# Patient Record
Sex: Female | Born: 1995 | Race: White | Hispanic: No | Marital: Single | Smoking: Never smoker
Health system: Southern US, Community
[De-identification: ages and names within clinical notes are randomized; demographics above are authoritative.]

## PROBLEM LIST (undated history)

## (undated) DIAGNOSIS — I10 Essential (primary) hypertension: Secondary | ICD-10-CM

## (undated) HISTORY — PX: CHOLECYSTECTOMY: SHX55

---
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
## 2016-12-29 ENCOUNTER — Emergency Department (HOSPITAL_COMMUNITY)
Admission: EM | Admit: 2016-12-29 | Discharge: 2016-12-30 | Disposition: A | Discharge status: Home / Self Care | Payer: Medicaid Other | Attending: Emergency Medicine | Admitting: Emergency Medicine

## 2016-12-29 ENCOUNTER — Encounter (HOSPITAL_COMMUNITY): Payer: Self-pay | Admitting: Emergency Medicine

## 2016-12-29 DIAGNOSIS — R39198 Other difficulties with micturition: Secondary | ICD-10-CM | POA: Diagnosis present

## 2016-12-29 DIAGNOSIS — B3731 Acute candidiasis of vulva and vagina: Secondary | ICD-10-CM

## 2016-12-29 DIAGNOSIS — B373 Candidiasis of vulva and vagina: Secondary | ICD-10-CM | POA: Diagnosis not present

## 2016-12-29 LAB — URINALYSIS, ROUTINE W REFLEX MICROSCOPIC
BILIRUBIN URINE: NEGATIVE
Glucose, UA: NEGATIVE mg/dL
Ketones, ur: NEGATIVE mg/dL
NITRITE: NEGATIVE
Protein, ur: >=300 mg/dL — AB
SPECIFIC GRAVITY, URINE: 1.026 (ref 1.005–1.030)
pH: 5 (ref 5.0–8.0)

## 2016-12-29 LAB — PREGNANCY, URINE: Preg Test, Ur: NEGATIVE

## 2016-12-29 MED ORDER — PHENAZOPYRIDINE HCL 100 MG PO TABS
200.0000 mg | ORAL_TABLET | Freq: Once | ORAL | Status: AC
  Administered 2016-12-29: 200 mg via ORAL
  Filled 2016-12-29: qty 2

## 2016-12-29 NOTE — ED Triage Notes (Signed)
UTI SX since last night  Pt reports no PCP

## 2016-12-30 MED ORDER — FLUCONAZOLE 150 MG PO TABS: ORAL_TABLET | ORAL | 0 refills | Status: DC

## 2016-12-30 NOTE — Discharge Instructions (Signed)
Take the medicine as prescribed.

## 2016-12-30 NOTE — ED Provider Notes (Signed)
[facility] EMERGENCY DEPARTMENT Provider Note   CSN: [id number] Arrival date & time: 12/29/16  2012     History   Chief Complaint Chief Complaint  Patient presents with  . Urinary Frequency    HPI Katherine Moran is a 21 y.o. female presenting with burning pain with urination since yesterday evening.  She denies increased urinary frequency and has had no nausea, vomiting, abdominal or pelvic or back pain, hematuria but has noticed mild burning irritation along with white slightly increased vaginal discharge.  She denies possibility of STDs.  HPI  History reviewed. No pertinent past medical history.  There are no active problems to display for this patient.   History reviewed. No pertinent surgical history.  OB History    No data available       Home Medications    Prior to Admission medications   Medication Sig Start Date End Date Taking? Authorizing Provider  fluconazole (DIFLUCAN) 150 MG tablet Take one tablet by mouth on day 1, then one tablet on day 3 if symptoms persist 12/30/16   Burgess AmorIdol, Ilaisaane Marts, PA-C    Family History History reviewed. No pertinent family history.  Social History Social History   Tobacco Use  . Smoking status: Never Smoker  . Smokeless tobacco: Never Used  Substance Use Topics  . Alcohol use: No  . Drug use: No     Allergies   Patient has no known allergies.   Review of Systems Review of Systems  Constitutional: Negative for fever.  HENT: Negative for congestion and sore throat.   Eyes: Negative.   Respiratory: Negative for chest tightness and shortness of breath.   Cardiovascular: Negative for chest pain.  Gastrointestinal: Negative for abdominal pain and nausea.  Genitourinary: Positive for dysuria and vaginal discharge. Negative for frequency, hematuria, menstrual problem and urgency.  Musculoskeletal: Negative for arthralgias, joint swelling and neck pain.  Skin: Negative.  Negative for rash and wound.  Neurological:  Negative for dizziness, weakness, light-headedness, numbness and headaches.  Psychiatric/Behavioral: Negative.      Physical Exam Updated Vital Signs BP (!) 135/97 (BP Location: Right Arm)   Pulse (!) 104   Temp 97.6 F (36.4 C) (Tympanic)   Resp 20   Ht 5\' 5"  (1.651 m)   Wt 77.1 kg (170 lb)   SpO2 97%   BMI 28.29 kg/m   Physical Exam  Constitutional: She appears well-developed and well-nourished.  HENT:  Head: Normocephalic and atraumatic.  Eyes: Conjunctivae are normal.  Neck: Normal range of motion.  Cardiovascular: Normal rate, regular rhythm, normal heart sounds and intact distal pulses.  Pulmonary/Chest: Effort normal and breath sounds normal. She has no wheezes.  Abdominal: Soft. Bowel sounds are normal. There is no tenderness.  Musculoskeletal: Normal range of motion.  Neurological: She is alert.  Skin: Skin is warm and dry.  Psychiatric: She has a normal mood and affect.  Nursing note and vitals reviewed.    ED Treatments / Results  Labs (all labs ordered are listed, but only abnormal results are displayed) Results for orders placed or performed during the hospital encounter of 12/29/16  Pregnancy, urine  Result Value Ref Range   Preg Test, Ur NEGATIVE NEGATIVE  Urinalysis, Routine w reflex microscopic  Result Value Ref Range   Color, Urine YELLOW YELLOW   APPearance CLOUDY (A) CLEAR   Specific Gravity, Urine 1.026 1.005 - 1.030   pH 5.0 5.0 - 8.0   Glucose, UA NEGATIVE NEGATIVE mg/dL   Hgb urine dipstick LARGE (  A) NEGATIVE   Bilirubin Urine NEGATIVE NEGATIVE   Ketones, ur NEGATIVE NEGATIVE mg/dL   Protein, ur >=409>=300 (A) NEGATIVE mg/dL   Nitrite NEGATIVE NEGATIVE   Leukocytes, UA LARGE (A) NEGATIVE   RBC / HPF TOO NUMEROUS TO COUNT 0 - 5 RBC/hpf   WBC, UA 6-30 0 - 5 WBC/hpf   Bacteria, UA RARE (A) NONE SEEN   Squamous Epithelial / LPF 6-30 (A) NONE SEEN   Mucus PRESENT    Budding Yeast PRESENT    Ca Oxalate Crys, UA PRESENT    Non Squamous  Epithelial 6-30 (A) NONE SEEN   No results found.   EKG  EKG Interpretation None       Radiology No results found.  Procedures Procedures (including critical care time)  Medications Ordered in ED Medications  phenazopyridine (PYRIDIUM) tablet 200 mg (200 mg Oral Given 12/29/16 2228)     Initial Impression / Assessment and Plan / ED Course  I have reviewed the triage vital signs and the nursing notes.  Pertinent labs & imaging results that were available during my care of the patient were reviewed by me and considered in my medical decision making (see chart for details).     Labs reviewed and discussed with patient.  No clear-cut UTI, culture was sent.  She does have yeast however which would also explain the vaginal discharge.  Discussed full pelvic exam rule out other possibilities such as STDs, patient defers at this time.  I will treat her with Diflucan and she was advised recheck for any persistent or worsening symptoms.  The patient appears reasonably screened and/or stabilized for discharge and I doubt any other medical condition or other [facility] requiring further screening, evaluation, or treatment in the ED at this time prior to discharge.   Final Clinical Impressions(s) / ED Diagnoses   Final diagnoses:  Yeast vaginitis    ED Discharge Orders        Ordered    fluconazole (DIFLUCAN) 150 MG tablet     12/30/16 0019       Burgess Amordol, Lajuanna Pompa, PA-C 12/30/16 Judie [id number]    Zammit, Joseph, MD 12/31/16 [phone]

## 2017-01-01 LAB — URINE CULTURE: Culture: 70000 — AB

## 2017-01-02 ENCOUNTER — Telehealth: Payer: Self-pay

## 2017-01-02 NOTE — Progress Notes (Signed)
ED Antimicrobial Stewardship Positive Culture Follow Up   Katherine Moran is an 21 y.o. female who presented to [facility] 12/29/2016 with a chief complaint of  Chief Complaint  Patient presents with  . Urinary Frequency    Recent Results (from the past 720 hour(s))  Urine Culture     Status: Abnormal   Collection Time: 12/30/16 12:16 AM  Result Value Ref Range Status   Specimen Description URINE, CLEAN CATCH  Final   Special Requests NONE  Final   Culture 70,000 COLONIES/mL ESCHERICHIA COLI (A)  Final   Report Status 01/01/2017 FINAL  Final   Organism ID, Bacteria ESCHERICHIA COLI (A)  Final      Susceptibility   Escherichia coli - MIC*    AMPICILLIN >=32 RESISTANT Resistant     CEFAZOLIN <=4 SENSITIVE Sensitive     CEFTRIAXONE <=1 SENSITIVE Sensitive     CIPROFLOXACIN <=0.25 SENSITIVE Sensitive     GENTAMICIN >=16 RESISTANT Resistant     IMIPENEM <=0.25 SENSITIVE Sensitive     NITROFURANTOIN <=16 SENSITIVE Sensitive     TRIMETH/SULFA >=320 RESISTANT Resistant     AMPICILLIN/SULBACTAM 16 INTERMEDIATE Intermediate     PIP/TAZO <=4 SENSITIVE Sensitive     Extended ESBL NEGATIVE Sensitive     * 70,000 COLONIES/mL ESCHERICHIA COLI    [x]  Treated with fluconazole, organism resistant to prescribed antimicrobial []  Patient discharged originally without antimicrobial agent and treatment is now indicated  New antibiotic prescription: If pt still with symptoms, start cephalexin 500mg  PO BID x 7 days  ED Provider: Frederik PearMia McDonald, PA   [name], [name] 01/02/2017, 9:41 AM Clinical Pharmacist Phone# [phone]

## 2017-01-02 NOTE — Telephone Encounter (Signed)
Post ED Visit - Positive Culture Follow-up: Successful Patient Follow-Up  Culture assessed and recommendations reviewed by: []  Enzo BiNathan Batchelder, Pharm.D. []  Celedonio MiyamotoJeremy Frens, Pharm.D., BCPS AQ-ID []  Garvin FilaMike Maccia, Pharm.D., BCPS []  Georgina PillionElizabeth Martin, Pharm.D., BCPS []  [place] Pham, [place]arm.D., BCPS, AAHIVP []  Estella HuskMichelle Turner, Pharm.D., BCPS, AAHIVP [x]  Lysle Pearlachel Rumbarger, PharmD, BCPS []  Casilda Carlsaylor Stone, PharmD, BCPS []  Pollyann SamplesAndy Johnston, PharmD, BCPS  Positive urine culture  [x]  Patient discharged without antimicrobial prescription and treatment is now indicated []  Organism is resistant to prescribed ED discharge antimicrobial []  Patient with positive blood cultures  Changes discussed with ED provider: Frederik PearMia McDonald PA-C New antibiotic prescription Keflex 500 mg BID x 7 days [facility] [phone]  Contacted patient, date 01/02/17, time 1000   Katherine Moran, Linnell FullingRose Burnett 01/02/2017, 9:59 AM

## 2017-04-28 IMAGING — DX DG LUMBAR SPINE COMPLETE 4+V
5 series · 5 of 5 positions shown · non-contrast
Comparison: Plain films lumbar spine 01/05/2015 and 12/01/2014.

CLINICAL DATA: Low back pain since moving furniture yesterday.
Initial encounter.

EXAM:
LUMBAR SPINE - COMPLETE 4+ VIEW

[l-spine ap]
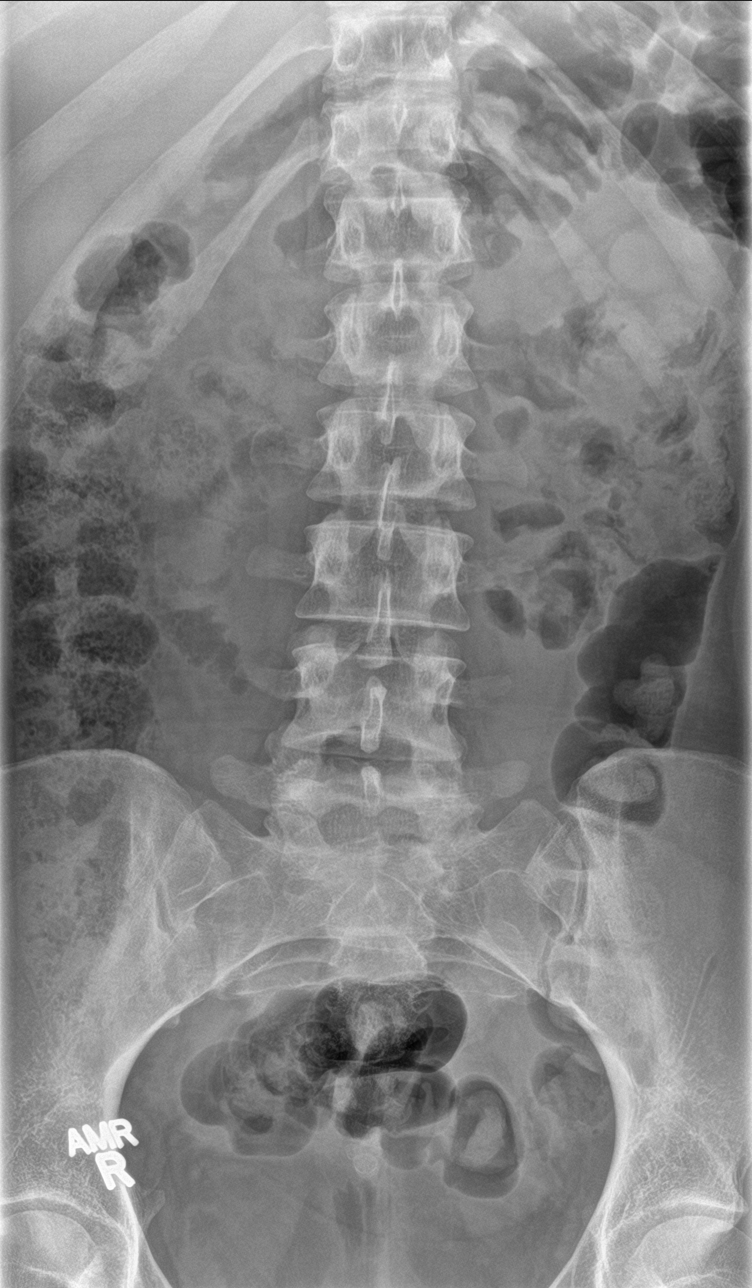

[l-spine obl (1 of 2)]
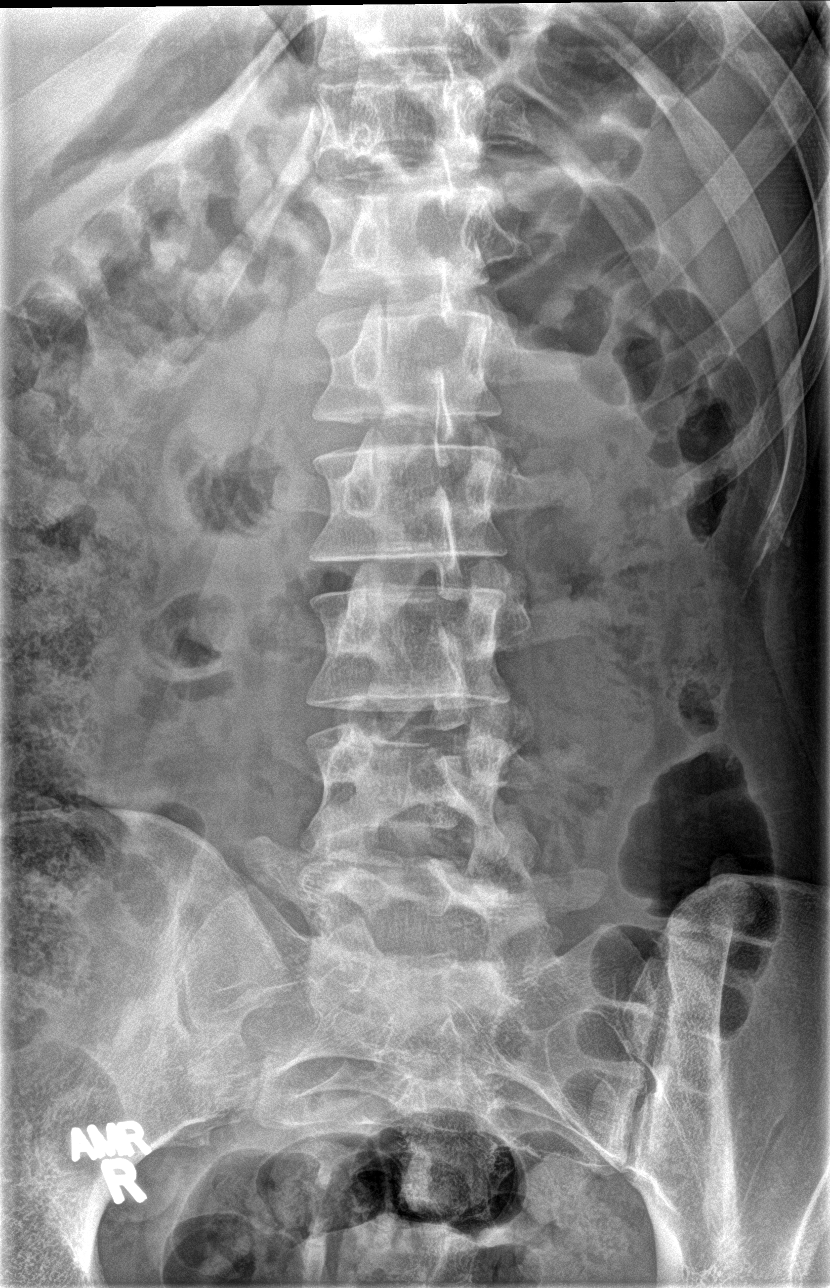

[l-spine obl (2 of 2)]
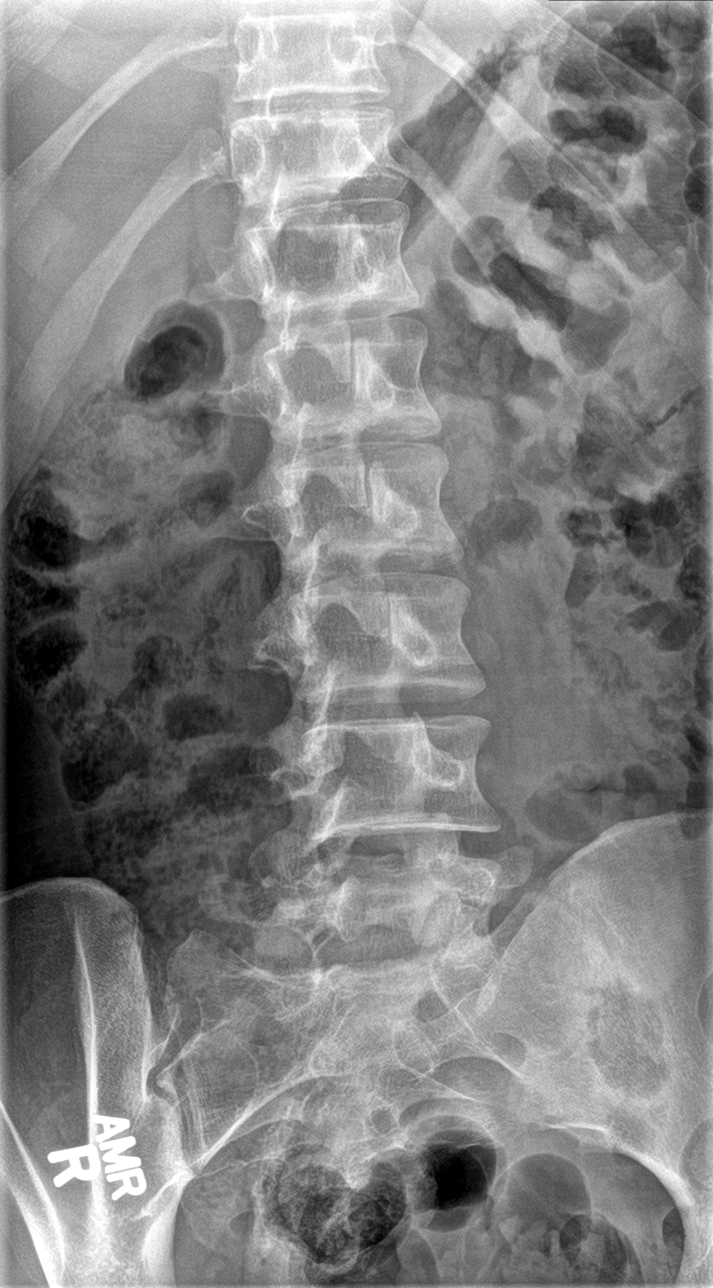

[l-spine lat]
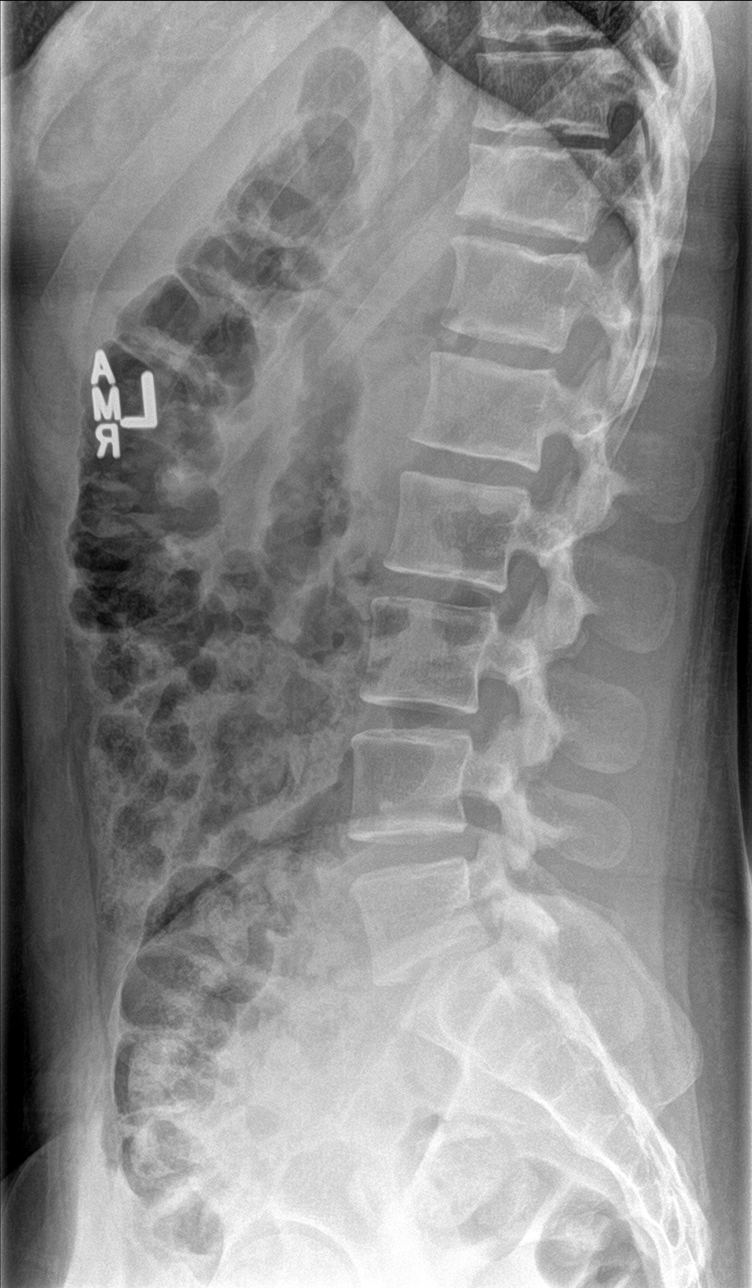

[l-spine spot]
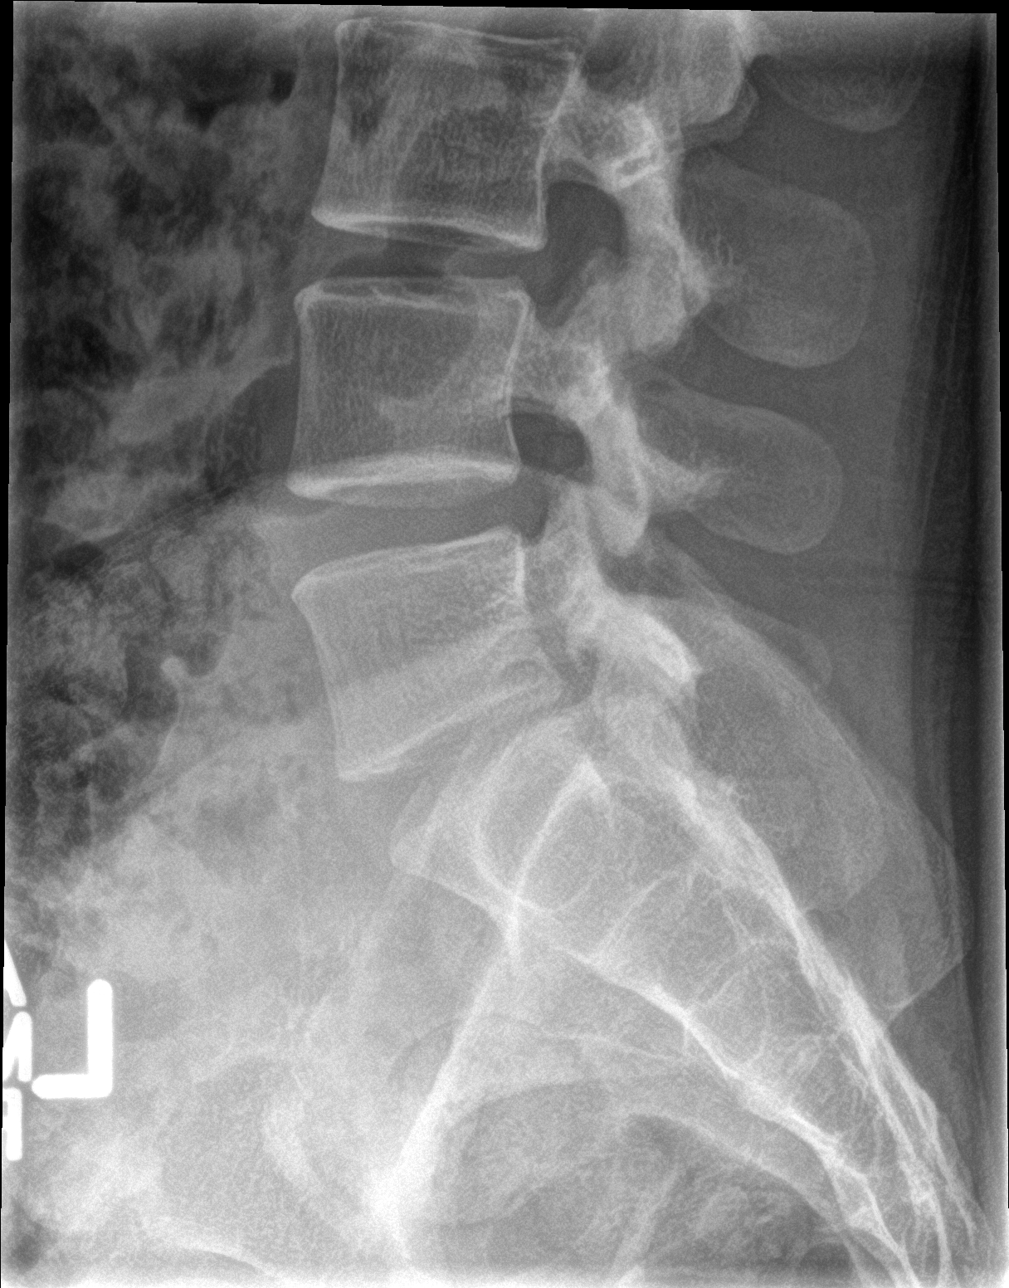

[5 of 5 positions shown; findings below may reference images not displayed]

FINDINGS: As noted on the prior examinations,the patient has 6 lumbar type
vertebral bodies. Vertebral body height and alignment are normal.
Intervertebral disc space height is maintained. There is no pars
interarticularis defect. Paraspinous structures are unremarkable.
IMPRESSION: Negative exam.

## 2020-10-15 ENCOUNTER — Other Ambulatory Visit: Payer: Self-pay

## 2020-10-15 ENCOUNTER — Encounter (HOSPITAL_COMMUNITY): Payer: Self-pay | Admitting: Emergency Medicine

## 2020-10-15 ENCOUNTER — Emergency Department (HOSPITAL_COMMUNITY)
Admission: EM | Admit: 2020-10-15 | Discharge: 2020-10-15 | Disposition: A | Discharge status: Home / Self Care | Payer: Medicaid - Out of State | Attending: Emergency Medicine | Admitting: Emergency Medicine

## 2020-10-15 DIAGNOSIS — N939 Abnormal uterine and vaginal bleeding, unspecified: Secondary | ICD-10-CM | POA: Insufficient documentation

## 2020-10-15 DIAGNOSIS — I10 Essential (primary) hypertension: Secondary | ICD-10-CM | POA: Insufficient documentation

## 2020-10-15 DIAGNOSIS — R1030 Lower abdominal pain, unspecified: Secondary | ICD-10-CM | POA: Diagnosis present

## 2020-10-15 HISTORY — DX: Essential (primary) hypertension: I10

## 2020-10-15 LAB — COMPREHENSIVE METABOLIC PANEL
ALT: 37 U/L (ref 0–44)
AST: 22 U/L (ref 15–41)
Albumin: 4.3 g/dL (ref 3.5–5.0)
Alkaline Phosphatase: 75 U/L (ref 38–126)
Anion gap: 11 (ref 5–15)
BUN: 13 mg/dL (ref 6–20)
CO2: 25 mmol/L (ref 22–32)
Calcium: 9.7 mg/dL (ref 8.9–10.3)
Chloride: 103 mmol/L (ref 98–111)
Creatinine, Ser: 0.84 mg/dL (ref 0.44–1.00)
GFR, Estimated: >60 mL/min (ref >60)
Glucose, Bld: 99 mg/dL (ref 70–99)
Potassium: 4 mmol/L (ref 3.5–5.1)
Sodium: 139 mmol/L (ref 135–145)
Total Bilirubin: 0.5 mg/dL (ref 0.3–1.2)
Total Protein: 8 g/dL (ref 6.5–8.1)

## 2020-10-15 LAB — CBC
HCT: 41.3 % (ref 36.0–46.0)
Hemoglobin: 13.5 g/dL (ref 12.0–15.0)
MCH: 27.5 pg (ref 26.0–34.0)
MCHC: 32.7 g/dL (ref 30.0–36.0)
MCV: 84.1 fL (ref 80.0–100.0)
Platelets: 327 10*3/uL (ref 150–400)
RBC: 4.91 MIL/uL (ref 3.87–5.11)
RDW: 13.8 % (ref 11.5–15.5)
WBC: 10.1 10*3/uL (ref 4.0–10.5)
nRBC: 0 % (ref 0.0–0.2)

## 2020-10-15 LAB — LIPASE, BLOOD: Lipase: 33 U/L (ref 11–51)

## 2020-10-15 LAB — URINALYSIS, MICROSCOPIC (REFLEX): RBC / HPF: >50 RBC/hpf (ref 0–5)

## 2020-10-15 LAB — URINALYSIS, ROUTINE W REFLEX MICROSCOPIC
Specific Gravity, Urine: 1.025 (ref 1.005–1.030)
pH: 5.5 (ref 5.0–8.0)

## 2020-10-15 LAB — HCG, QUANTITATIVE, PREGNANCY: hCG, Beta Chain, Quant, S: <1 m[IU]/mL (ref <5)

## 2020-10-15 MED ORDER — NAPROXEN 500 MG PO TABS: 500.0000 mg | ORAL_TABLET | Freq: Two times a day (BID) | ORAL | 0 refills | Status: AC

## 2020-10-15 MED ORDER — ACETAMINOPHEN 500 MG PO TABS
1000.0000 mg | ORAL_TABLET | Freq: Once | ORAL | Status: AC
  Administered 2020-10-15: 1000 mg via ORAL
  Filled 2020-10-15: qty 2

## 2020-10-15 NOTE — ED Provider Notes (Signed)
[facility]-EMERGENCY DEPT [facility] Emergency Department Provider Note MRN:  [id number]  Arrival date & time: 10/15/20     Chief Complaint   Abdominal Pain   History of Present Illness   Katherine Moran is a 25 y.o. year-old female with no pertinent past medical history presenting to the ED with chief complaint of abdominal pain, vaginal bleeding.  Location: Lower abdomen Duration: 2 days Onset: Gradual Timing: Constant, comes in waves Description: Cramping Severity: Moderate Exacerbating/Alleviating Factors: None Associated Symptoms: Heavy vaginal bleeding Pertinent Negatives: No fever, no chest pain or shortness of breath, no upper abdominal pain, no vaginal discharge, no rough sex or vaginal trauma  Additional History: None  Review of Systems  A complete 10 system review of systems was obtained and all systems are negative except as noted in the HPI and PMH.   Patient's Health History    Past Medical History:  Diagnosis Date   Hypertension     History reviewed. No pertinent surgical history.  History reviewed. No pertinent family history.  Social History   Socioeconomic History   Marital status: Single    Spouse name: Not on file   Number of children: Not on file   Years of education: Not on file   Highest education level: Not on file  Occupational History   Not on file  Tobacco Use   Smoking status: Never   Smokeless tobacco: Never  Vaping Use   Vaping Use: Never used  Substance and Sexual Activity   Alcohol use: No   Drug use: No   Sexual activity: Yes    Birth control/protection: None  Other Topics Concern   Not on file  Social History Narrative   Not on file   Social Determinants of Health   Financial Resource Strain: Not on file  Food Insecurity: Not on file  Transportation Needs: Not on file  Physical Activity: Not on file  Stress: Not on file  Social Connections: Not on file  Intimate Partner Violence: Not on file     Physical Exam    Vitals:   10/15/20 0052 10/15/20 0150  BP: (!) 144/95 129/73  Pulse: 93 86  Resp: 20 18  Temp: 98.6 F (37 C)   SpO2: 100% 100%    CONSTITUTIONAL: Well-appearing, NAD NEURO:  Alert and oriented x 3, no focal deficits EYES:  eyes equal and reactive ENT/NECK:  no LAD, no JVD CARDIO: Regular rate, well-perfused, normal S1 and S2 PULM:  CTAB no wheezing or rhonchi GI/GU:  normal bowel sounds, non-distended, non-tender MSK/SPINE:  No gross deformities, no edema SKIN:  no rash, atraumatic PSYCH:  Appropriate speech and behavior  *Additional and/or pertinent findings included in MDM below  Diagnostic and Interventional Summary    EKG Interpretation  Date/Time:    Ventricular Rate:    PR Interval:    QRS Duration:   QT Interval:    QTC Calculation:   R Axis:     Text Interpretation:         Labs Reviewed  URINALYSIS, ROUTINE W REFLEX MICROSCOPIC - Abnormal; Notable for the following components:      Result Value   Color, Urine RED (*)    Glucose, UA   (*)    Value: TEST NOT REPORTED DUE TO COLOR INTERFERENCE OF URINE PIGMENT   Hgb urine dipstick   (*)    Value: TEST NOT REPORTED DUE TO COLOR INTERFERENCE OF URINE PIGMENT   Bilirubin Urine   (*)    Value: TEST  NOT REPORTED DUE TO COLOR INTERFERENCE OF URINE PIGMENT   Ketones, ur   (*)    Value: TEST NOT REPORTED DUE TO COLOR INTERFERENCE OF URINE PIGMENT   Protein, ur   (*)    Value: TEST NOT REPORTED DUE TO COLOR INTERFERENCE OF URINE PIGMENT   Nitrite   (*)    Value: TEST NOT REPORTED DUE TO COLOR INTERFERENCE OF URINE PIGMENT   Leukocytes,Ua   (*)    Value: TEST NOT REPORTED DUE TO COLOR INTERFERENCE OF URINE PIGMENT   All other components within normal limits  URINALYSIS, MICROSCOPIC (REFLEX) - Abnormal; Notable for the following components:   Bacteria, UA MANY (*)    All other components within normal limits  LIPASE, BLOOD  COMPREHENSIVE METABOLIC PANEL  CBC  HCG, QUANTITATIVE, PREGNANCY    No orders  to display    Medications  acetaminophen (TYLENOL) tablet 1,000 mg (1,000 mg Oral Given 10/15/20 0211)     Procedures  /  Critical Care Procedures  ED Course and Medical Decision Making  I have reviewed the triage vital signs, the nursing notes, and pertinent available records from the EMR.  Listed above are laboratory and imaging tests that I personally ordered, reviewed, and interpreted and then considered in my medical decision making (see below for details).  Abdomen is soft and nontender, vital signs are normal, no fever.  Patient reports heavy bleeding, much heavier than normal menstrual cycle, but the timing of this bleeding would be consistent with her normal menstrual cycle.  hCG is negative, largely excluding ectopic pregnancy.  H&H is reassuring.  Patient is appropriate for discharge with GYN follow-up.       Elmer Sow. Pilar Plate, MD [facility] Emergency Medicine [facility] [email or web address]  Final Clinical Impressions(s) / ED Diagnoses     ICD-10-CM   1. Abnormal uterine bleeding  N93.9       ED Discharge Orders          Ordered    naproxen (NAPROSYN) 500 MG tablet  2 times daily        10/15/20 0406             Discharge Instructions Discussed with and Provided to Patient:    Discharge Instructions      You were evaluated in the Emergency Department and after careful evaluation, we did not find any emergent condition requiring admission or further testing in the hospital.  Your exam/testing today is overall reassuring.  Recommend follow-up with an OB/GYN doctor to further discuss her symptoms.  Can use the Naprosyn anti-inflammatory as needed for pain.  Please return to the Emergency Department if you experience any worsening of your condition.   Thank you for allowing [place] to be a part of your care.       Sabas Sous, MD 10/15/20 [phone]

## 2020-10-15 NOTE — Discharge Instructions (Signed)
You were evaluated in the Emergency Department and after careful evaluation, we did not find any emergent condition requiring admission or further testing in the hospital.  Your exam/testing today is overall reassuring.  Recommend follow-up with an OB/GYN doctor to further discuss her symptoms.  Can use the Naprosyn anti-inflammatory as needed for pain.  Please return to the Emergency Department if you experience any worsening of your condition.   Thank you for allowing [place] to be a part of your care.

## 2020-10-15 NOTE — ED Triage Notes (Signed)
Pt c/o abd pain for the past 2 weeks. Pt states today she began having heavy vaginal bleeding.

## 2022-09-09 ENCOUNTER — Encounter (HOSPITAL_COMMUNITY): Payer: Self-pay

## 2022-09-09 ENCOUNTER — Other Ambulatory Visit: Payer: Self-pay

## 2022-09-09 ENCOUNTER — Emergency Department (HOSPITAL_COMMUNITY): Payer: Medicaid Other

## 2022-09-09 ENCOUNTER — Emergency Department (HOSPITAL_COMMUNITY)
Admission: EM | Admit: 2022-09-09 | Discharge: 2022-09-09 | Disposition: A | Discharge status: Home / Self Care | Payer: Medicaid Other | Attending: Emergency Medicine | Admitting: Emergency Medicine

## 2022-09-09 DIAGNOSIS — R230 Cyanosis: Secondary | ICD-10-CM | POA: Diagnosis present

## 2022-09-09 NOTE — ED Provider Notes (Signed)
[place] [facility]MENT AT [facility] Provider Note   CSN: [id number] Arrival date & time: 09/09/22  0158     History  Chief Complaint  Patient presents with   Bleeding/Bruising    Katherine Moran is a 27 y.o. female.  The history is provided by the patient.  She noted some bruising/discoloration on the knuckles of both hands and on her right elbow.  She noticed that tonight.  She denies any trauma.  She is complaining of some mild pain in all of those joints.  Of note, she is approximately 1 month status post cholecystectomy, doing well from that standpoint.   Home Medications Prior to Admission medications   Medication Sig Start Date End Date Taking? Authorizing Provider  fluconazole (DIFLUCAN) 150 MG tablet Take one tablet by mouth on day 1, then one tablet on day 3 if symptoms persist 12/30/16   Idol, Raynelle Fanning, PA-C  naproxen (NAPROSYN) 500 MG tablet Take 1 tablet (500 mg total) by mouth 2 (two) times daily. 10/15/20   Sabas Sous, MD      Allergies    Norco [hydrocodone-acetaminophen]    Review of Systems   Review of Systems  All other systems reviewed and are negative.   Physical Exam Updated Vital Signs BP (!) 145/102 (BP Location: Right Arm)   Pulse 98   Temp 98.6 F (37 C) (Oral)   Resp 18   Ht 5\' 5"  (1.651 m)   Wt 106.6 kg   LMP 08/09/2022 (Approximate)   SpO2 98%   BMI 39.11 kg/m  Physical Exam Vitals and nursing note reviewed.   27 year old female, resting comfortably and in no acute distress. Vital signs are significant for elevated blood pressure. Oxygen saturation is 98%, which is normal. Head is normocephalic and atraumatic. PERRLA, EOMI.  Lungs are clear without rales, wheezes, or rhonchi. Chest is nontender. Heart has regular rate and rhythm without murmur. Abdomen is soft, flat, nontender. Extremities: There is a faint, plaque discoloration over the right olecranon area and the dorsum of the MCP joints of both hands.   There is minimal tenderness, no swelling.  Full passive range of motion is present. Skin is warm and dry without rash. Neurologic: Mental status is normal, cranial nerves are intact, moves all extremities equally.  ED Results / Procedures / Treatments    Radiology DG Hand Complete Left  Result Date: 09/09/2022 CLINICAL DATA:  Bruising. EXAM: LEFT HAND - COMPLETE 3+ VIEW COMPARISON:  None Available. FINDINGS: There is no evidence of fracture or dislocation. There is no evidence of arthropathy or other focal bone abnormality. Mild thenar soft tissue edema. IMPRESSION: Mild thenar soft tissue edema. No fracture or subluxation. Electronically Signed   By: Narda Rutherford M.D.   On: 09/09/2022 03:54   DG Hand Complete Right  Result Date: 09/09/2022 CLINICAL DATA:  Bruising. EXAM: RIGHT HAND - COMPLETE 3+ VIEW COMPARISON:  None Available. FINDINGS: There is no evidence of fracture or dislocation. There is no evidence of arthropathy or other focal bone abnormality. Soft tissues are unremarkable. IMPRESSION: Negative radiographs of the right hand. Electronically Signed   By: Narda Rutherford M.D.   On: 09/09/2022 03:53   DG Elbow Complete Right  Result Date: 09/09/2022 CLINICAL DATA:  Bruising. EXAM: RIGHT ELBOW - COMPLETE 3+ VIEW COMPARISON:  None Available. FINDINGS: There is no evidence of fracture, dislocation, or joint effusion. There is no evidence of arthropathy or other focal bone abnormality. Mild soft tissue edema posteriorly.  IMPRESSION: Mild soft tissue edema. Electronically Signed   By: Narda Rutherford M.D.   On: 09/09/2022 03:52    Procedures Procedures    Medications Ordered in ED Medications - No data to display  ED Course/ Medical Decision Making/ A&P                                 Medical Decision Making Amount and/or Complexity of Data Reviewed Radiology: ordered.   Discoloration over the right olecranon and the MCP joints of both hands.  This does not appear to be  ecchymosis, but with pain and tenderness I have ordered x-rays of both hands and the right elbow.  X-rays show no evidence of acute injury.  I have independently viewed the images, and agree with radiologist's interpretation.  I have advised the patient to consider dermatology consultation if discoloration persists or spreads.  Final Clinical Impression(s) / ED Diagnoses Final diagnoses:  Bluish skin discoloration    Rx / DC Orders ED Discharge Orders     None         Dione Booze, MD 09/09/22 [phone]

## 2022-09-09 NOTE — ED Triage Notes (Signed)
Pt arrived from home via POV c/o bilateral bruising to knuckles of hands. Pt denies injury or trauma. Pt denies any new medications. Pt still has perfusion and sensation in all digits.

## 2022-09-09 NOTE — ED Notes (Signed)
Pt stated, "skin started getting purple earlier today." Denies losing feeling, numbness/tingling. Still able to move fingers and arms. Had gall bladder surgery two weeks ago.

## 2022-09-09 NOTE — Discharge Instructions (Signed)
If the discoloration persists or gets worse, consider being evaluated by a dermatologist.

## 2022-09-12 LAB — CBG MONITORING, ED: Glucose-Capillary: 117 mg/dL — ABNORMAL HIGH (ref 70–99)
